# Patient Record
Sex: Female | Born: 2000 | Hispanic: No | Marital: Single | Smoking: Never smoker
Health system: Southern US, Community
[De-identification: ages and names within clinical notes are randomized; demographics above are authoritative.]

## PROBLEM LIST (undated history)

## (undated) ENCOUNTER — Inpatient Hospital Stay (HOSPITAL_COMMUNITY): Payer: Self-pay

## (undated) ENCOUNTER — Ambulatory Visit: Admission: EM

## (undated) DIAGNOSIS — F419 Anxiety disorder, unspecified: Secondary | ICD-10-CM

## (undated) DIAGNOSIS — D649 Anemia, unspecified: Secondary | ICD-10-CM

## (undated) DIAGNOSIS — R062 Wheezing: Secondary | ICD-10-CM

## (undated) DIAGNOSIS — N390 Urinary tract infection, site not specified: Secondary | ICD-10-CM

## (undated) HISTORY — PX: NO PAST SURGERIES: SHX2092

---
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
## 2020-06-10 ENCOUNTER — Other Ambulatory Visit: Payer: Self-pay

## 2020-06-10 ENCOUNTER — Emergency Department (HOSPITAL_COMMUNITY)
Admission: EM | Admit: 2020-06-10 | Discharge: 2020-06-10 | Disposition: A | Discharge status: Home / Self Care | Payer: Medicaid Other | Attending: Emergency Medicine | Admitting: Emergency Medicine

## 2020-06-10 ENCOUNTER — Encounter (HOSPITAL_COMMUNITY): Payer: Self-pay | Admitting: Emergency Medicine

## 2020-06-10 DIAGNOSIS — M545 Low back pain, unspecified: Secondary | ICD-10-CM | POA: Insufficient documentation

## 2020-06-10 DIAGNOSIS — Y92410 Unspecified street and highway as the place of occurrence of the external cause: Secondary | ICD-10-CM | POA: Insufficient documentation

## 2020-06-10 MED ORDER — CYCLOBENZAPRINE HCL 10 MG PO TABS: 10.0000 mg | ORAL_TABLET | Freq: Every day | ORAL | 0 refills | Status: DC

## 2020-06-10 NOTE — Discharge Instructions (Signed)
Please read instructions below. °Apply ice to your areas of pain for 20 minutes at a time. °You can take 600 mg of Advil/ibuprofen every 6 hours as needed for pain. °You can take flexeril every 12 hours as needed for muscle spasm. Be aware this medication can make you drowsy. °Schedule an appointment with your primary care provider to follow up on your visit today. °Return to the ER for severely worsening headache, vision changes, if new numbness or weakness in your arms or legs, inability to urinate, inability to hold your bowels, or concerning symptoms. ° °
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
## 2020-06-10 NOTE — ED Notes (Signed)
Patient left before discharge process could be completed.  
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
## 2020-08-10 ENCOUNTER — Other Ambulatory Visit: Payer: Self-pay

## 2020-08-10 ENCOUNTER — Emergency Department (HOSPITAL_COMMUNITY)
Admission: EM | Admit: 2020-08-10 | Discharge: 2020-08-10 | Disposition: A | Discharge status: Home / Self Care | Payer: Medicaid Other | Attending: Emergency Medicine | Admitting: Emergency Medicine

## 2020-08-10 DIAGNOSIS — R002 Palpitations: Secondary | ICD-10-CM | POA: Diagnosis present

## 2020-08-10 DIAGNOSIS — Z5321 Procedure and treatment not carried out due to patient leaving prior to being seen by health care provider: Secondary | ICD-10-CM | POA: Diagnosis not present

## 2020-08-10 DIAGNOSIS — R110 Nausea: Secondary | ICD-10-CM | POA: Diagnosis not present

## 2020-08-10 LAB — BASIC METABOLIC PANEL
Anion gap: 10 (ref 5–15)
BUN: 13 mg/dL (ref 6–20)
CO2: 23 mmol/L (ref 22–32)
Calcium: 9.3 mg/dL (ref 8.9–10.3)
Chloride: 104 mmol/L (ref 98–111)
Creatinine, Ser: 0.73 mg/dL (ref 0.44–1.00)
GFR, Estimated: >60 mL/min (ref >60)
Glucose, Bld: 86 mg/dL (ref 70–99)
Potassium: 3.4 mmol/L — ABNORMAL LOW (ref 3.5–5.1)
Sodium: 137 mmol/L (ref 135–145)

## 2020-08-10 LAB — I-STAT BETA HCG BLOOD, ED (MC, WL, AP ONLY): I-stat hCG, quantitative: <5 m[IU]/mL (ref <5)

## 2020-08-10 LAB — CBC
HCT: 38.3 % (ref 36.0–46.0)
Hemoglobin: 12.1 g/dL (ref 12.0–15.0)
MCH: 26.1 pg (ref 26.0–34.0)
MCHC: 31.6 g/dL (ref 30.0–36.0)
MCV: 82.5 fL (ref 80.0–100.0)
Platelets: 259 10*3/uL (ref 150–400)
RBC: 4.64 MIL/uL (ref 3.87–5.11)
RDW: 13.8 % (ref 11.5–15.5)
WBC: 5.9 10*3/uL (ref 4.0–10.5)
nRBC: 0 % (ref 0.0–0.2)

## 2020-08-10 NOTE — ED Notes (Signed)
Pt notified registration that she is not staying any longer

## 2020-08-10 NOTE — ED Triage Notes (Signed)
C/O heart palpitations going on for a month along w/ nausea. Denies PMH.

## 2020-09-03 ENCOUNTER — Encounter (HOSPITAL_COMMUNITY): Payer: Self-pay | Admitting: Emergency Medicine

## 2020-09-03 ENCOUNTER — Emergency Department (HOSPITAL_COMMUNITY): Payer: Medicaid Other

## 2020-09-03 ENCOUNTER — Other Ambulatory Visit: Payer: Self-pay

## 2020-09-03 ENCOUNTER — Emergency Department (HOSPITAL_COMMUNITY)
Admission: EM | Admit: 2020-09-03 | Discharge: 2020-09-03 | Disposition: A | Discharge status: Home / Self Care | Payer: Medicaid Other | Attending: Emergency Medicine | Admitting: Emergency Medicine

## 2020-09-03 DIAGNOSIS — R0602 Shortness of breath: Secondary | ICD-10-CM | POA: Diagnosis not present

## 2020-09-03 DIAGNOSIS — R002 Palpitations: Secondary | ICD-10-CM | POA: Diagnosis not present

## 2020-09-03 DIAGNOSIS — R0789 Other chest pain: Secondary | ICD-10-CM | POA: Diagnosis not present

## 2020-09-03 LAB — BASIC METABOLIC PANEL
Anion gap: 9 (ref 5–15)
BUN: 7 mg/dL (ref 6–20)
CO2: 24 mmol/L (ref 22–32)
Calcium: 9.2 mg/dL (ref 8.9–10.3)
Chloride: 105 mmol/L (ref 98–111)
Creatinine, Ser: 0.71 mg/dL (ref 0.44–1.00)
GFR, Estimated: >60 mL/min (ref >60)
Glucose, Bld: 91 mg/dL (ref 70–99)
Potassium: 3.4 mmol/L — ABNORMAL LOW (ref 3.5–5.1)
Sodium: 138 mmol/L (ref 135–145)

## 2020-09-03 LAB — CBC
HCT: 36.7 % (ref 36.0–46.0)
Hemoglobin: 12.2 g/dL (ref 12.0–15.0)
MCH: 27.4 pg (ref 26.0–34.0)
MCHC: 33.2 g/dL (ref 30.0–36.0)
MCV: 82.3 fL (ref 80.0–100.0)
Platelets: 226 10*3/uL (ref 150–400)
RBC: 4.46 MIL/uL (ref 3.87–5.11)
RDW: 14.5 % (ref 11.5–15.5)
WBC: 5.7 10*3/uL (ref 4.0–10.5)
nRBC: 0 % (ref 0.0–0.2)

## 2020-09-03 LAB — C-REACTIVE PROTEIN: CRP: <0.5 mg/dL (ref <1.0)

## 2020-09-03 LAB — I-STAT BETA HCG BLOOD, ED (MC, WL, AP ONLY): I-stat hCG, quantitative: <5 m[IU]/mL (ref <5)

## 2020-09-03 LAB — TROPONIN I (HIGH SENSITIVITY)
Troponin I (High Sensitivity): 2 ng/L (ref <18)
Troponin I (High Sensitivity): 3 ng/L (ref <18)

## 2020-09-03 LAB — BRAIN NATRIURETIC PEPTIDE: B Natriuretic Peptide: 9.9 pg/mL (ref 0.0–100.0)

## 2020-09-03 MED ORDER — NAPROXEN 250 MG PO TABS
500.0000 mg | ORAL_TABLET | Freq: Once | ORAL | Status: AC
  Administered 2020-09-03: 500 mg via ORAL
  Filled 2020-09-03: qty 2

## 2020-09-03 MED ORDER — POTASSIUM CHLORIDE CRYS ER 20 MEQ PO TBCR
40.0000 meq | EXTENDED_RELEASE_TABLET | Freq: Once | ORAL | Status: AC
  Administered 2020-09-03: 40 meq via ORAL
  Filled 2020-09-03: qty 2

## 2020-09-03 NOTE — ED Provider Notes (Signed)
[facility] EMERGENCY DEPARTMENT Provider Note   CSN: [id number] Arrival date & time: 09/03/20  1216     History Chief Complaint  Patient presents with  . Chest Pain    Jill Sweeney is a 20 y.o. female.  The history is provided by the patient.  Chest Pain Pain location:  L chest Pain quality: pressure   Pain radiates to:  Does not radiate Pain severity:  Mild Onset quality:  Gradual Duration:  3 weeks Timing:  Intermittent Progression:  Waxing and waning Chronicity:  Recurrent Relieved by:  Nothing Worsened by:  Nothing Ineffective treatments:  None tried Associated symptoms: palpitations and shortness of breath   Associated symptoms: no abdominal pain, no back pain, no cough, no fever and no vomiting   Risk factors: no birth control, not pregnant and no prior DVT/PE   Palpitations Palpitations quality:  Irregular Onset quality:  At rest Duration:  30 minutes Timing:  Intermittent Progression:  Waxing and waning Chronicity:  New Context: not caffeine, not dehydration and not exercise   Relieved by:  Nothing Worsened by:  Nothing Ineffective treatments:  None tried Associated symptoms: chest pain and shortness of breath   Associated symptoms: no back pain, no cough and no vomiting        Past Medical History:  Diagnosis Date  . Anemia   . Wheezing    as a small child    Patient Active Problem List   Diagnosis Date Noted  . Thyroiditis 02/02/2019  . Anemia 02/02/2019    History reviewed. No pertinent surgical history.   OB History   No obstetric history on file.     Family History  Problem Relation Age of Onset  . Obesity Mother   . Hypothyroidism Mother   . Diabetes Father     Social History   Tobacco Use  . Smoking status: Never Smoker  . Smokeless tobacco: Never Used  Substance Use Topics  . Alcohol use: No  . Drug use: No    Home Medications Prior to Admission medications   Medication Sig Start  Date End Date Taking? Authorizing Provider  amoxicillin (AMOXIL) 250 MG/5ML suspension Take 10 mL (500 mg total) by mouth 3 (three) times daily. Patient not taking: Reported on 09/03/2017 03/22/13   Marcellina Millin, MD  amoxicillin (AMOXIL) 500 MG capsule Take 1 capsule (500 mg total) by mouth 3 (three) times daily. Patient not taking: Reported on 09/03/2017 03/22/13   Marcellina Millin, MD  cyclobenzaprine (FLEXERIL) 10 MG tablet Take 1 tablet (10 mg total) by mouth at bedtime. 06/10/20   Robinson, [place] N, PA-C  ibuprofen (ADVIL,MOTRIN) 100 MG/5ML suspension Take 25.6 mLs (512 mg total) by mouth every 6 (six) hours as needed for fever or mild pain. Patient not taking: Reported on 09/03/2017 01/28/15   Marcellina Millin, MD  ibuprofen (CHILD IBUPROFEN) 100 MG/5ML suspension Take 20 mLs (400 mg total) by mouth every 6 (six) hours as needed (sore throat). Patient not taking: Reported on 09/03/2017 11/14/16   Ree Shay, MD  Multiple Vitamin (MULTIVITAMIN WITH MINERALS) TABS tablet Take 1 tablet by mouth daily.    [provider]  ondansetron (ZOFRAN) 4 MG tablet Take 1 tablet (4 mg total) by mouth every 8 (eight) hours as needed for nausea or vomiting. Patient not taking: Reported on 02/02/2019 09/03/17   Jacinto Halim, PA-C    Allergies    Patient has no known allergies.  Review of Systems   Review of Systems  Constitutional: Negative for chills and fever.  HENT: Negative for ear pain and sore throat.   Eyes: Negative for pain and visual disturbance.  Respiratory: Positive for shortness of breath. Negative for cough.   Cardiovascular: Positive for chest pain and palpitations.  Gastrointestinal: Negative for abdominal pain and vomiting.  Genitourinary: Negative for dysuria and hematuria.  Musculoskeletal: Negative for arthralgias and back pain.  Skin: Negative for color change and rash.  Neurological: Negative for seizures and syncope.  All other systems reviewed and are  negative.   Physical Exam Updated Vital Signs BP 93/62 (BP Location: Right Arm)   Pulse 70   Temp 98.3 F (36.8 C) (Oral)   Resp 18   LMP 08/27/2020   SpO2 99%   Physical Exam Vitals and nursing note reviewed.  Constitutional:      General: She is not in acute distress.    Appearance: She is well-developed and well-nourished.  HENT:     Head: Normocephalic and atraumatic.  Eyes:     Conjunctiva/sclera: Conjunctivae normal.  Cardiovascular:     Rate and Rhythm: Normal rate and regular rhythm.     Heart sounds: No murmur heard.   Pulmonary:     Effort: Pulmonary effort is normal. No respiratory distress.     Breath sounds: Normal breath sounds.  Abdominal:     Palpations: Abdomen is soft.     Tenderness: There is no abdominal tenderness.  Musculoskeletal:        General: No edema.     Cervical back: Neck supple.     Right lower leg: No tenderness. No edema.     Left lower leg: No tenderness. No edema.  Skin:    General: Skin is warm and dry.     Coloration: Skin is not pale.  Neurological:     Mental Status: She is alert.  Psychiatric:        Mood and Affect: Mood and affect normal.     ED Results / Procedures / Treatments   Labs (all labs ordered are listed, but only abnormal results are displayed) Labs Reviewed  BASIC METABOLIC PANEL - Abnormal; Notable for the following components:      Result Value   Potassium 3.4 (*)    All other components within normal limits  CBC  BRAIN NATRIURETIC PEPTIDE  C-REACTIVE PROTEIN  I-STAT BETA HCG BLOOD, ED ([facility], [facility], [facility] ONLY)  TROPONIN I (HIGH SENSITIVITY)  TROPONIN I (HIGH SENSITIVITY)    EKG EKG Interpretation  Date/Time:  Saturday September 03 2020 12:34:30 EST Ventricular Rate:  86 PR Interval:  132 QRS Duration: 72 QT Interval:  356 QTC Calculation: 426 R Axis:   108 Text Interpretation: Normal sinus rhythm Rightward axis ST-t wave abnormality Abnormal ECG Confirmed by Gerhard Munch [phone]) on 09/03/2020  3:41:33 PM   Radiology DG Chest 2 View  Result Date: 09/03/2020 CLINICAL DATA:  Mid to left-sided chest pain for 2 weeks but worsening last night. Shortness of breath. EXAM: CHEST - 2 VIEW COMPARISON:  02/03/2011 FINDINGS: The heart size and mediastinal contours are within normal limits. Both lungs are clear. The visualized skeletal structures are unremarkable. IMPRESSION: No active cardiopulmonary disease. Electronically Signed   By: Gaylyn Rong M.D.   On: 09/03/2020 12:54    Procedures Procedures  Medications Ordered in ED Medications  naproxen (NAPROSYN) tablet 500 mg (500 mg Oral Given 09/03/20 1643)  potassium chloride SA (KLOR-CON) CR tablet 40 mEq (40 mEq Oral Given 09/03/20 1711)    ED  Course  I have reviewed the triage vital signs and the nursing notes.  Pertinent labs & imaging results that were available during my care of the patient were reviewed by me and considered in my medical decision making (see chart for details).    MDM Rules/Calculators/A&P                          This is a 20 year old female with a reported past medical history of asthma, not on current medications, who presents emergency department for evaluation of chest pain, palpitations, and shortness of breath. Patient reports approximately 3 weeks ago she started to develop some chest pressure at times, this did not seem to be worse with activity and comes at random times usually when she is sitting down. Last night she had some shortness of breath and felt as though she had difficulty catching her breath but did not have much pain at that time. She also describes intermittent episodes of palpitations, feeling of her heart racing which are not provoked with any activity. Patient was diagnosed with COVID via home test approximately 1 month ago but did not develop severe symptoms or need to be hospitalized.  On exam the patient is afebrile, hemodynamically stable, well-appearing, heart sounds are normal  without any abnormalities on lung auscultation. Patient has no calf tenderness, swelling, skin changes. Low risk Wells, PERC negative.  Low suspicion for ACS or PE as she is not tachycardic, hypoxic, low risk factors for ACS. Patient's family history is negative for cardiac conditions and sudden cardiac death. Given her recent viral illness of COVID and now having chest pain, palpitations, will evaluate for possible underlying pericarditis myocarditis.   ECG showed normal sinus rhythm, rate of 86, borderline right axis deviation, PR intervals within normal limits, QRS is within normal limits, QTC is not prolonged, there is some nonspecific ST and T wave abnormalities noted in the inferior leads and lateral leads.  Question lead misplacement, repeat EKG does show normal sinus rhythm without significant ST or T wave abnormalities are previously in, the repeat EKG is more consistent with the one that she had in early January and appears to be normal.  Bedside echo shows no evidence of pericardial effusion, no right ventricle dilatation, maintained EF, no wall motion abnormalities.  Troponin within normal limits, BNP is not elevated, CRP is within normal limits, she is not tachycardic or febrile.  I doubt that she has a post viral myocarditis versus pericarditis.  She was instructed to use Tylenol and ibuprofen as needed for pain.  She was instructed to follow-up with her primary care physician to discuss possible further evaluation with loop recorder/Holter monitor if she feels that her palpitations are worsening.  Discussed caffeine restrictions that can worsen palpitations.   Final Clinical Impression(s) / ED Diagnoses Final diagnoses:  Atypical chest pain    Rx / DC Orders ED Discharge Orders    None       Kathleen Lime, MD 09/03/20 1755    Gerhard Munch, MD 09/04/20 [phone]

## 2020-09-03 NOTE — ED Triage Notes (Signed)
C/o heart palpitations, chest pain, and SOB x 4 weeks.

## 2020-09-03 NOTE — Discharge Instructions (Signed)
The blood work, chest x-ray, and EKG show that your heart is functioning normally and has no signs of damage at this time.  Please follow-up with your primary care physician, please use Tylenol and ibuprofen as needed to help with pain.  If the pain worsens, becomes constant, you feel you are more short of breath, passing out, or feel your heart rate is very rapid, please return to the emergency department.

## 2020-09-17 ENCOUNTER — Encounter (HOSPITAL_COMMUNITY): Payer: Self-pay

## 2020-09-17 ENCOUNTER — Emergency Department (HOSPITAL_COMMUNITY)
Admission: EM | Admit: 2020-09-17 | Discharge: 2020-09-18 | Disposition: A | Discharge status: Home / Self Care | Payer: Medicaid Other | Attending: Emergency Medicine | Admitting: Emergency Medicine

## 2020-09-17 DIAGNOSIS — R1030 Lower abdominal pain, unspecified: Secondary | ICD-10-CM | POA: Insufficient documentation

## 2020-09-17 DIAGNOSIS — Z5321 Procedure and treatment not carried out due to patient leaving prior to being seen by health care provider: Secondary | ICD-10-CM | POA: Insufficient documentation

## 2020-09-17 DIAGNOSIS — K5900 Constipation, unspecified: Secondary | ICD-10-CM | POA: Insufficient documentation

## 2020-09-17 LAB — URINALYSIS, ROUTINE W REFLEX MICROSCOPIC
Bacteria, UA: NONE SEEN
Bilirubin Urine: NEGATIVE
Glucose, UA: NEGATIVE mg/dL
Hgb urine dipstick: NEGATIVE
Ketones, ur: NEGATIVE mg/dL
Leukocytes,Ua: NEGATIVE
Nitrite: NEGATIVE
Protein, ur: NEGATIVE mg/dL
Specific Gravity, Urine: 1.023 (ref 1.005–1.030)
pH: 5 (ref 5.0–8.0)

## 2020-09-17 LAB — CBC
HCT: 35.7 % — ABNORMAL LOW (ref 36.0–46.0)
Hemoglobin: 12.1 g/dL (ref 12.0–15.0)
MCH: 27.9 pg (ref 26.0–34.0)
MCHC: 33.9 g/dL (ref 30.0–36.0)
MCV: 82.3 fL (ref 80.0–100.0)
Platelets: 255 10*3/uL (ref 150–400)
RBC: 4.34 MIL/uL (ref 3.87–5.11)
RDW: 14.6 % (ref 11.5–15.5)
WBC: 7.4 10*3/uL (ref 4.0–10.5)
nRBC: 0 % (ref 0.0–0.2)

## 2020-09-17 LAB — COMPREHENSIVE METABOLIC PANEL
ALT: 9 U/L (ref 0–44)
AST: 18 U/L (ref 15–41)
Albumin: 4.3 g/dL (ref 3.5–5.0)
Alkaline Phosphatase: 56 U/L (ref 38–126)
Anion gap: 11 (ref 5–15)
BUN: 10 mg/dL (ref 6–20)
CO2: 25 mmol/L (ref 22–32)
Calcium: 9.3 mg/dL (ref 8.9–10.3)
Chloride: 103 mmol/L (ref 98–111)
Creatinine, Ser: 0.69 mg/dL (ref 0.44–1.00)
GFR, Estimated: >60 mL/min (ref >60)
Glucose, Bld: 100 mg/dL — ABNORMAL HIGH (ref 70–99)
Potassium: 3.3 mmol/L — ABNORMAL LOW (ref 3.5–5.1)
Sodium: 139 mmol/L (ref 135–145)
Total Bilirubin: 0.6 mg/dL (ref 0.3–1.2)
Total Protein: 6.9 g/dL (ref 6.5–8.1)

## 2020-09-17 LAB — I-STAT BETA HCG BLOOD, ED (MC, WL, AP ONLY): I-stat hCG, quantitative: <5 m[IU]/mL (ref <5)

## 2020-09-17 LAB — LIPASE, BLOOD: Lipase: 32 U/L (ref 11–51)

## 2020-09-17 NOTE — ED Notes (Signed)
Pt called for vitals reassessment x3 with no response 

## 2020-09-17 NOTE — ED Triage Notes (Addendum)
Pt repots that she has been having lower abd pain, worse on the L side and also having problems with constipation.

## 2020-12-25 ENCOUNTER — Emergency Department (HOSPITAL_COMMUNITY)
Admission: EM | Admit: 2020-12-25 | Discharge: 2020-12-25 | Disposition: A | Discharge status: Home / Self Care | Payer: Medicaid Other | Attending: Emergency Medicine | Admitting: Emergency Medicine

## 2020-12-25 ENCOUNTER — Encounter (HOSPITAL_COMMUNITY): Payer: Self-pay | Admitting: Emergency Medicine

## 2020-12-25 ENCOUNTER — Other Ambulatory Visit: Payer: Self-pay

## 2020-12-25 DIAGNOSIS — E860 Dehydration: Secondary | ICD-10-CM | POA: Insufficient documentation

## 2020-12-25 DIAGNOSIS — Z20822 Contact with and (suspected) exposure to covid-19: Secondary | ICD-10-CM | POA: Diagnosis not present

## 2020-12-25 DIAGNOSIS — R55 Syncope and collapse: Secondary | ICD-10-CM | POA: Insufficient documentation

## 2020-12-25 LAB — COMPREHENSIVE METABOLIC PANEL
ALT: 11 U/L (ref 0–44)
AST: 17 U/L (ref 15–41)
Albumin: 4.1 g/dL (ref 3.5–5.0)
Alkaline Phosphatase: 48 U/L (ref 38–126)
Anion gap: 8 (ref 5–15)
BUN: 8 mg/dL (ref 6–20)
CO2: 27 mmol/L (ref 22–32)
Calcium: 9.5 mg/dL (ref 8.9–10.3)
Chloride: 103 mmol/L (ref 98–111)
Creatinine, Ser: 0.77 mg/dL (ref 0.44–1.00)
GFR, Estimated: >60 mL/min (ref >60)
Glucose, Bld: 89 mg/dL (ref 70–99)
Potassium: 3.6 mmol/L (ref 3.5–5.1)
Sodium: 138 mmol/L (ref 135–145)
Total Bilirubin: 0.5 mg/dL (ref 0.3–1.2)
Total Protein: 7.1 g/dL (ref 6.5–8.1)

## 2020-12-25 LAB — I-STAT BETA HCG BLOOD, ED (MC, WL, AP ONLY): I-stat hCG, quantitative: <5 m[IU]/mL (ref <5)

## 2020-12-25 LAB — CBC WITH DIFFERENTIAL/PLATELET
Abs Immature Granulocytes: 0.01 10*3/uL (ref 0.00–0.07)
Basophils Absolute: 0 10*3/uL (ref 0.0–0.1)
Basophils Relative: 1 %
Eosinophils Absolute: 0.1 10*3/uL (ref 0.0–0.5)
Eosinophils Relative: 1 %
HCT: 39.4 % (ref 36.0–46.0)
Hemoglobin: 12.9 g/dL (ref 12.0–15.0)
Immature Granulocytes: 0 %
Lymphocytes Relative: 30 %
Lymphs Abs: 1.8 10*3/uL (ref 0.7–4.0)
MCH: 28.9 pg (ref 26.0–34.0)
MCHC: 32.7 g/dL (ref 30.0–36.0)
MCV: 88.3 fL (ref 80.0–100.0)
Monocytes Absolute: 0.6 10*3/uL (ref 0.1–1.0)
Monocytes Relative: 10 %
Neutro Abs: 3.6 10*3/uL (ref 1.7–7.7)
Neutrophils Relative %: 58 %
Platelets: 269 10*3/uL (ref 150–400)
RBC: 4.46 MIL/uL (ref 3.87–5.11)
RDW: 14.7 % (ref 11.5–15.5)
WBC: 6.1 10*3/uL (ref 4.0–10.5)
nRBC: 0 % (ref 0.0–0.2)

## 2020-12-25 LAB — RESP PANEL BY RT-PCR (FLU A&B, COVID) ARPGX2
Influenza A by PCR: NEGATIVE
Influenza B by PCR: NEGATIVE
SARS Coronavirus 2 by RT PCR: NEGATIVE

## 2020-12-25 MED ORDER — SODIUM CHLORIDE 0.9 % IV BOLUS
1000.0000 mL | Freq: Once | INTRAVENOUS | Status: AC
  Administered 2020-12-25: 1000 mL via INTRAVENOUS

## 2020-12-25 NOTE — ED Notes (Signed)
Pt given cranberry juice.

## 2020-12-25 NOTE — ED Triage Notes (Signed)
Patient from home, complaint of syncopal episode 2 days ago and since then constant feeling of weakness. Endorses hx of anemia.

## 2020-12-25 NOTE — ED Provider Notes (Signed)
[facility] EMERGENCY DEPARTMENT Provider Note   CSN: [id number] Arrival date & time: 12/25/20  1417     History Chief Complaint  Patient presents with  . Loss of Consciousness    Jill Sweeney is a 20 y.o. female.  Pt presents to the ED today with a loc.  The pt said she passed out 2 days ago.  She said she got up from her bed and was walking to the bathroom when she blacked out.  She said she woke up shaking all over for about 2 minutes.  She has not felt well since then.  She denies any pain now.          Past Medical History:  Diagnosis Date  . Anemia   . Wheezing    as a small child    Patient Active Problem List   Diagnosis Date Noted  . Thyroiditis 02/02/2019  . Anemia 02/02/2019    History reviewed. No pertinent surgical history.   OB History   No obstetric history on file.     Family History  Problem Relation Age of Onset  . Obesity Mother   . Hypothyroidism Mother   . Diabetes Father     Social History   Tobacco Use  . Smoking status: Never Smoker  . Smokeless tobacco: Never Used  Substance Use Topics  . Alcohol use: No  . Drug use: No    Home Medications Prior to Admission medications   Medication Sig Start Date End Date Taking? Authorizing Provider  amoxicillin (AMOXIL) 250 MG/5ML suspension Take 10 mL (500 mg total) by mouth 3 (three) times daily. Patient not taking: Reported on 09/03/2017 03/22/13   Marcellina Millin, MD  amoxicillin (AMOXIL) 500 MG capsule Take 1 capsule (500 mg total) by mouth 3 (three) times daily. Patient not taking: Reported on 09/03/2017 03/22/13   Marcellina Millin, MD  cyclobenzaprine (FLEXERIL) 10 MG tablet Take 1 tablet (10 mg total) by mouth at bedtime. 06/10/20   Robinson, [place] N, PA-C  ibuprofen (ADVIL,MOTRIN) 100 MG/5ML suspension Take 25.6 mLs (512 mg total) by mouth every 6 (six) hours as needed for fever or mild pain. Patient not taking: Reported on 09/03/2017 01/28/15   Marcellina Millin, MD  ibuprofen (CHILD IBUPROFEN) 100 MG/5ML suspension Take 20 mLs (400 mg total) by mouth every 6 (six) hours as needed (sore throat). Patient not taking: Reported on 09/03/2017 11/14/16   Ree Shay, MD  Multiple Vitamin (MULTIVITAMIN WITH MINERALS) TABS tablet Take 1 tablet by mouth daily.    [provider]  ondansetron (ZOFRAN) 4 MG tablet Take 1 tablet (4 mg total) by mouth every 8 (eight) hours as needed for nausea or vomiting. Patient not taking: Reported on 02/02/2019 09/03/17   Jacinto Halim, PA-C    Allergies    Patient has no known allergies.  Review of Systems   Review of Systems  Constitutional: Positive for fatigue.  Neurological: Positive for syncope and weakness.  All other systems reviewed and are negative.   Physical Exam Updated Vital Signs BP (!) 97/58   Pulse 73   Temp 98 F (36.7 C)   Resp 15   SpO2 100%   Physical Exam Vitals and nursing note reviewed.  Constitutional:      Appearance: Normal appearance.  HENT:     Head: Normocephalic and atraumatic.     Right Ear: External ear normal.     Left Ear: External ear normal.     Nose: Nose normal.  Mouth/Throat:     Mouth: Mucous membranes are moist.     Pharynx: Oropharynx is clear.  Eyes:     Extraocular Movements: Extraocular movements intact.     Conjunctiva/sclera: Conjunctivae normal.     Pupils: Pupils are equal, round, and reactive to light.  Cardiovascular:     Rate and Rhythm: Normal rate and regular rhythm.     Pulses: Normal pulses.     Heart sounds: Normal heart sounds.  Pulmonary:     Effort: Pulmonary effort is normal.     Breath sounds: Normal breath sounds.  Abdominal:     General: Abdomen is flat. Bowel sounds are normal.     Palpations: Abdomen is soft.  Musculoskeletal:        General: Normal range of motion.     Cervical back: Normal range of motion and neck supple.  Skin:    General: Skin is warm.     Capillary Refill: Capillary refill takes less  than 2 seconds.  Neurological:     General: No focal deficit present.     Mental Status: She is alert and oriented to person, place, and time.  Psychiatric:        Mood and Affect: Mood normal.        Behavior: Behavior normal.        Thought Content: Thought content normal.        Judgment: Judgment normal.     ED Results / Procedures / Treatments   Labs (all labs ordered are listed, but only abnormal results are displayed) Labs Reviewed  RESP PANEL BY RT-PCR (FLU A&B, COVID) [id number]  COMPREHENSIVE METABOLIC PANEL  CBC WITH DIFFERENTIAL/PLATELET  URINALYSIS, ROUTINE W REFLEX MICROSCOPIC  I-STAT BETA HCG BLOOD, ED ([facility], [facility], [facility] ONLY)    EKG EKG Interpretation  Date/Time:  Sunday Dec 25 2020 15:22:41 EDT Ventricular Rate:  80 PR Interval:  132 QRS Duration: 70 QT Interval:  358 QTC Calculation: 412 R Axis:   92 Text Interpretation: Normal sinus rhythm Rightward axis Borderline ECG No significant change since last tracing Confirmed by Jacalyn Lefevre [phone]) on 12/25/2020 6:03:11 PM   Radiology No results found.  Procedures Procedures   Medications Ordered in ED Medications  sodium chloride 0.9 % bolus 1,000 mL (1,000 mLs Intravenous New Bag/Given 12/25/20 1926)    ED Course  I have reviewed the triage vital signs and the nursing notes.  Pertinent labs & imaging results that were available during my care of the patient were reviewed by me and considered in my medical decision making (see chart for details).    MDM Rules/Calculators/A&P                         Covid neg.  Pt is feeling much better.  Her bp is a little low (90s), but that is likely normal for her.  She is 4'11" and only 45 kg.  She is stable for d/c.  Return if worse.  Final Clinical Impression(s) / ED Diagnoses Final diagnoses:  Dehydration  Syncope, unspecified syncope type    Rx / DC Orders ED Discharge Orders    None       Jacalyn Lefevre, MD 12/25/20 2044

## 2020-12-25 NOTE — ED Provider Notes (Signed)
Emergency Medicine Provider Triage Evaluation Note  Jill Sweeney 20 y.o. female was evaluated in triage.  Pt complains of for evaluation of a syncopal episode that occurred 2 days ago.  She reports that since then, she has felt weak, lightheaded.  She states she has been fatigued as well.  She has a history of anemia.  She has never had a transfusion.  Denies any rectal bleeding.  She denies any chest pain, abdominal pain, nausea/vomiting.  Her LMP was 2 weeks ago.  She has had some intermittent shortness of breath.   Review of Systems  Positive: Weakness, fatigue, lightheadedness Negative: Fevers, abdominal pain, nausea/vomiting.  Physical Exam  BP 134/82   Pulse 70   Temp 98.2 F (36.8 C) (Oral)   Resp 18   Ht 5\' 4"  (1.626 m)   Wt 65.8 kg   SpO2 100%   BMI 24.89 kg/m  Gen:   Awake, no distress   HEENT:  Atraumatic  Resp:  Normal effort.  Able to speak in full sentences without any difficulty. Cardiac:  Normal rate  Abd:   Nondistended, nontender  MSK:   Moves extremities without difficulty  Neuro:  Speech clear   Other:      Medical Decision Making  Medically screening exam initiated at 3:02 PM  Appropriate orders placed.  Jill Sweeney was informed that the remainder of the evaluation will be completed by another provider, this initial triage assessment does not replace that evaluation. They are counseled that they will need to remain in the ED until the completion of their workup, including full H&P and results of any tests.  Risks of leaving the emergency department prior to completion of treatment were discussed. Patient was advised to inform ED staff if they are leaving before their treatment is complete. The patient acknowledged these risks and time was allowed for questions.     The patient appears stable so that the remainder of the MSE may be completed by another provider.   Clinical Impression  Generalized weakness.   Portions of this note  were generated with . Dictation errors may occur despite best attempts at proofreading.      Scientist, clinical (histocompatibility and immunogenetics), PA-C 12/25/20 1503    12/27/20, MD 12/25/20 1515

## 2020-12-25 NOTE — ED Notes (Signed)
Dr. Particia Nearing made aware of pt BP 88/65

## 2020-12-25 NOTE — ED Notes (Signed)
E-signature pad unavailable at time of pt discharge. This RN discussed discharge materials with pt and answered all pt questions. Pt stated understanding of discharge material. ? ?

## 2021-04-20 ENCOUNTER — Encounter (HOSPITAL_COMMUNITY): Payer: Self-pay

## 2021-04-20 ENCOUNTER — Emergency Department (HOSPITAL_COMMUNITY)
Admission: EM | Admit: 2021-04-20 | Discharge: 2021-04-20 | Disposition: A | Discharge status: Home / Self Care | Payer: Medicaid Other | Attending: Emergency Medicine | Admitting: Emergency Medicine

## 2021-04-20 DIAGNOSIS — R55 Syncope and collapse: Secondary | ICD-10-CM | POA: Insufficient documentation

## 2021-04-20 DIAGNOSIS — R519 Headache, unspecified: Secondary | ICD-10-CM | POA: Insufficient documentation

## 2021-04-20 DIAGNOSIS — R002 Palpitations: Secondary | ICD-10-CM | POA: Diagnosis not present

## 2021-04-20 DIAGNOSIS — N9489 Other specified conditions associated with female genital organs and menstrual cycle: Secondary | ICD-10-CM | POA: Insufficient documentation

## 2021-04-20 DIAGNOSIS — R42 Dizziness and giddiness: Secondary | ICD-10-CM | POA: Diagnosis not present

## 2021-04-20 LAB — CBC WITH DIFFERENTIAL/PLATELET
Abs Immature Granulocytes: 0.02 10*3/uL (ref 0.00–0.07)
Basophils Absolute: 0 10*3/uL (ref 0.0–0.1)
Basophils Relative: 0 %
Eosinophils Absolute: 0 10*3/uL (ref 0.0–0.5)
Eosinophils Relative: 0 %
HCT: 35 % — ABNORMAL LOW (ref 36.0–46.0)
Hemoglobin: 11.4 g/dL — ABNORMAL LOW (ref 12.0–15.0)
Immature Granulocytes: 0 %
Lymphocytes Relative: 23 %
Lymphs Abs: 1.7 10*3/uL (ref 0.7–4.0)
MCH: 28.2 pg (ref 26.0–34.0)
MCHC: 32.6 g/dL (ref 30.0–36.0)
MCV: 86.6 fL (ref 80.0–100.0)
Monocytes Absolute: 0.7 10*3/uL (ref 0.1–1.0)
Monocytes Relative: 9 %
Neutro Abs: 4.7 10*3/uL (ref 1.7–7.7)
Neutrophils Relative %: 68 %
Platelets: 216 10*3/uL (ref 150–400)
RBC: 4.04 MIL/uL (ref 3.87–5.11)
RDW: 13.1 % (ref 11.5–15.5)
WBC: 7.1 10*3/uL (ref 4.0–10.5)
nRBC: 0 % (ref 0.0–0.2)

## 2021-04-20 LAB — COMPREHENSIVE METABOLIC PANEL
ALT: 11 U/L (ref 0–44)
AST: 17 U/L (ref 15–41)
Albumin: 3.9 g/dL (ref 3.5–5.0)
Alkaline Phosphatase: 47 U/L (ref 38–126)
Anion gap: 3 — ABNORMAL LOW (ref 5–15)
BUN: 11 mg/dL (ref 6–20)
CO2: 25 mmol/L (ref 22–32)
Calcium: 8.6 mg/dL — ABNORMAL LOW (ref 8.9–10.3)
Chloride: 115 mmol/L — ABNORMAL HIGH (ref 98–111)
Creatinine, Ser: 0.62 mg/dL (ref 0.44–1.00)
GFR, Estimated: >60 mL/min (ref >60)
Glucose, Bld: 82 mg/dL (ref 70–99)
Potassium: 3.7 mmol/L (ref 3.5–5.1)
Sodium: 143 mmol/L (ref 135–145)
Total Bilirubin: 0.5 mg/dL (ref 0.3–1.2)
Total Protein: 6.7 g/dL (ref 6.5–8.1)

## 2021-04-20 LAB — I-STAT BETA HCG BLOOD, ED (MC, WL, AP ONLY): I-stat hCG, quantitative: <5 m[IU]/mL (ref <5)

## 2021-04-20 LAB — TSH: TSH: 0.935 u[IU]/mL (ref 0.350–4.500)

## 2021-04-20 MED ORDER — LACTATED RINGERS IV BOLUS
1000.0000 mL | Freq: Once | INTRAVENOUS | Status: AC
  Administered 2021-04-20: 1000 mL via INTRAVENOUS

## 2021-04-20 MED ORDER — LACTATED RINGERS IV SOLN: INTRAVENOUS | Status: DC

## 2021-04-20 NOTE — ED Triage Notes (Signed)
Pt presents with c/o headache that started today with some associated dizziness and near syncope.

## 2021-04-20 NOTE — Discharge Instructions (Addendum)
You are seen in the emergency department today for what sounds like a near syncopal episode.  You also complained of having palpitations or feeling like her heart was racing.  While you were here we obtained lab work including checking your thyroid, which was all reassuring.  We also obtained a picture of your heart called EKG which was normal.  You have anemia on your lab work which you are aware of.  Continue taking your iron.  We gave you 1 L of IV fluids in the event that you are dehydrated.  I have asked that you call your primary care provider for a follow-up visit.  You also may return to the emergency department for any reason including return of your symptoms feel like her heart is racing, chest pain, passing out.

## 2021-04-20 NOTE — ED Provider Notes (Signed)
[facility]-EMERGENCY DEPT Provider Note   CSN: [id number] Arrival date & time: 04/20/21  1418     History Chief Complaint  Patient presents with   Headache   Near Syncope    Jill Sweeney is a 20 y.o. female.  With history of anemia who presents to the emergency department with palpitations.  States that today she was returning from school.  States that she was sitting on the couch and felt like her heart was racing.  States that she attempted to walk outside to call her brother but then began feeling like she was about to pass out.  States that she also felt "incoherent" like she was "going numb" for approximately 15 minutes.  States after that she just decided to call EMS.  She also endorses a headache that began last night, that is intermittent.  She denies shortness of breath, chest pain, fevers, cough, recent travel, nausea/vomiting/diarrhea/abdominal pain.  She states she had a syncopal episode 3 months ago.  States that she was seen at her primary care physician who said she was dehydrated.  States that her last menstrual period was 3 weeks ago.  Endorses heavy periods.  With her history of anemia she takes iron daily.   Headache Associated symptoms: dizziness and near-syncope   Associated symptoms: no abdominal pain, no cough, no fatigue, no fever, no nausea and no vomiting   Near Syncope Associated symptoms include headaches. Pertinent negatives include no chest pain, no abdominal pain and no shortness of breath.      Past Medical History:  Diagnosis Date   Anemia    Wheezing    as a small child    Patient Active Problem List   Diagnosis Date Noted   Thyroiditis 02/02/2019   Anemia 02/02/2019    History reviewed. No pertinent surgical history.   OB History   No obstetric history on file.     Family History  Problem Relation Age of Onset   Obesity Mother    Hypothyroidism Mother    Diabetes Father     Social History    Tobacco Use   Smoking status: Never   Smokeless tobacco: Never  Substance Use Topics   Alcohol use: No   Drug use: No    Home Medications Prior to Admission medications   Medication Sig Start Date End Date Taking? Authorizing Provider  amoxicillin (AMOXIL) 250 MG/5ML suspension Take 10 mL (500 mg total) by mouth 3 (three) times daily. Patient not taking: Reported on 09/03/2017 03/22/13   Marcellina Millin, MD  amoxicillin (AMOXIL) 500 MG capsule Take 1 capsule (500 mg total) by mouth 3 (three) times daily. Patient not taking: Reported on 09/03/2017 03/22/13   Marcellina Millin, MD  cyclobenzaprine (FLEXERIL) 10 MG tablet Take 1 tablet (10 mg total) by mouth at bedtime. 06/10/20   Robinson, [place] N, PA-C  ibuprofen (ADVIL,MOTRIN) 100 MG/5ML suspension Take 25.6 mLs (512 mg total) by mouth every 6 (six) hours as needed for fever or mild pain. Patient not taking: Reported on 09/03/2017 01/28/15   Marcellina Millin, MD  ibuprofen (CHILD IBUPROFEN) 100 MG/5ML suspension Take 20 mLs (400 mg total) by mouth every 6 (six) hours as needed (sore throat). Patient not taking: Reported on 09/03/2017 11/14/16   Ree Shay, MD  Multiple Vitamin (MULTIVITAMIN WITH MINERALS) TABS tablet Take 1 tablet by mouth daily.    [provider]  ondansetron (ZOFRAN) 4 MG tablet Take 1 tablet (4 mg total) by mouth every 8 (eight) hours  as needed for nausea or vomiting. Patient not taking: Reported on 02/02/2019 09/03/17   Jacinto Halim, PA-C    Allergies    Patient has no known allergies.  Review of Systems   Review of Systems  Constitutional:  Negative for diaphoresis, fatigue and fever.  HENT:  Negative for sinus pain.   Respiratory:  Negative for cough and shortness of breath.   Cardiovascular:  Positive for palpitations and near-syncope. Negative for chest pain.  Gastrointestinal:  Negative for abdominal pain, nausea and vomiting.  Neurological:  Positive for dizziness, light-headedness and headaches.   All other systems reviewed and are negative.  Physical Exam Updated Vital Signs BP (!) 95/57   Pulse 69   Temp 98.7 F (37.1 C) (Oral)   Resp 19   LMP 03/30/2021   SpO2 100%   Physical Exam Vitals and nursing note reviewed.  Constitutional:      Appearance: She is well-developed. She is not ill-appearing.  HENT:     Head: Normocephalic and atraumatic.  Eyes:     General: No scleral icterus.    Extraocular Movements: Extraocular movements intact.     Pupils: Pupils are equal, round, and reactive to light.  Cardiovascular:     Rate and Rhythm: Normal rate and regular rhythm.     Pulses:          Radial pulses are 2+ on the right side and 2+ on the left side.     Heart sounds: Normal heart sounds. No murmur heard. Pulmonary:     Effort: Pulmonary effort is normal. No respiratory distress.     Breath sounds: Normal breath sounds.  Abdominal:     General: Bowel sounds are normal.     Palpations: Abdomen is soft.     Tenderness: There is no abdominal tenderness.  Musculoskeletal:     Cervical back: Normal range of motion.     Right lower leg: No edema.     Left lower leg: No edema.  Skin:    General: Skin is warm and dry.     Capillary Refill: Capillary refill takes less than 2 seconds.  Neurological:     General: No focal deficit present.     Mental Status: She is alert and oriented to person, place, and time. Mental status is at baseline.     Sensory: No sensory deficit.  Psychiatric:        Mood and Affect: Mood normal.        Behavior: Behavior normal.        Thought Content: Thought content normal.        Judgment: Judgment normal.    ED Results / Procedures / Treatments   Labs (all labs ordered are listed, but only abnormal results are displayed) Labs Reviewed  CBC WITH DIFFERENTIAL/PLATELET - Abnormal; Notable for the following components:      Result Value   Hemoglobin 11.4 (*)    HCT 35.0 (*)    All other components within normal limits  COMPREHENSIVE  METABOLIC PANEL - Abnormal; Notable for the following components:   Chloride 115 (*)    Calcium 8.6 (*)    Anion gap 3 (*)    All other components within normal limits  TSH  I-STAT BETA HCG BLOOD, ED ([facility], [facility], [facility] ONLY)    EKG None  Radiology No results found.  Procedures Procedures   Medications Ordered in ED Medications  lactated ringers bolus 1,000 mL (0 mLs Intravenous Stopped 04/20/21 1725)  ED Course  I have reviewed the triage vital signs and the nursing notes.  Pertinent labs & imaging results that were available during my care of the patient were reviewed by me and considered in my medical decision making (see chart for details).    MDM Rules/Calculators/A&P Jill Sweeney is a 20 year old female with past medical history of anemia who presents emergency department for what sounds like presyncope/palpitations.  Given her history, exam and work-up I have low suspicion for heart failure or any intracranial abnormalities.  There is no reported seizure-like activity, she had no postictal period, no bladder incontinence so doubt seizure. Presentation is not consistent with ACS, no anginal pain. EKG without arrhythmia. Hemoglobin 11.4 which is near baseline of 12. I have low suspicion for pulmonary embolism.  Her vital signs are stable.  No hypoxia, shortness of breath.  No evidence of DVT on physical exam.  No other risk factors.  [place] syncope risk score of 0. She is low risk and well-appearing.  Her vital signs continue to be stable.  Plan to discharge home with primary care follow-up.  Patient is agreeable to plan.  Final Clinical Impression(s) / ED Diagnoses Final diagnoses:  Near syncope    Rx / DC Orders ED Discharge Orders     None        Cristopher Peru, PA-C 04/20/21 1746    Gloris Manchester, MD 04/21/21 [phone]

## 2021-04-20 NOTE — ED Triage Notes (Signed)
Per EMS-seen at [facility] a couple of days ago for dehydration-states she has been having H/A's for 3 months

## 2021-05-11 ENCOUNTER — Emergency Department (HOSPITAL_COMMUNITY)
Admission: EM | Admit: 2021-05-11 | Discharge: 2021-05-12 | Disposition: A | Discharge status: Home / Self Care | Payer: Medicaid Other | Attending: Emergency Medicine | Admitting: Emergency Medicine

## 2021-05-11 ENCOUNTER — Other Ambulatory Visit: Payer: Self-pay

## 2021-05-11 ENCOUNTER — Encounter (HOSPITAL_COMMUNITY): Payer: Self-pay

## 2021-05-11 DIAGNOSIS — R110 Nausea: Secondary | ICD-10-CM | POA: Insufficient documentation

## 2021-05-11 DIAGNOSIS — R309 Painful micturition, unspecified: Secondary | ICD-10-CM | POA: Diagnosis not present

## 2021-05-11 DIAGNOSIS — R300 Dysuria: Secondary | ICD-10-CM | POA: Diagnosis not present

## 2021-05-11 DIAGNOSIS — R109 Unspecified abdominal pain: Secondary | ICD-10-CM | POA: Insufficient documentation

## 2021-05-11 DIAGNOSIS — R1110 Vomiting, unspecified: Secondary | ICD-10-CM

## 2021-05-11 DIAGNOSIS — Z5321 Procedure and treatment not carried out due to patient leaving prior to being seen by health care provider: Secondary | ICD-10-CM | POA: Insufficient documentation

## 2021-05-11 LAB — CBC
HCT: 38.8 % (ref 36.0–46.0)
Hemoglobin: 12.9 g/dL (ref 12.0–15.0)
MCH: 28.4 pg (ref 26.0–34.0)
MCHC: 33.2 g/dL (ref 30.0–36.0)
MCV: 85.3 fL (ref 80.0–100.0)
Platelets: 255 10*3/uL (ref 150–400)
RBC: 4.55 MIL/uL (ref 3.87–5.11)
RDW: 13.3 % (ref 11.5–15.5)
WBC: 11.1 10*3/uL — ABNORMAL HIGH (ref 4.0–10.5)
nRBC: 0 % (ref 0.0–0.2)

## 2021-05-11 LAB — URINALYSIS, ROUTINE W REFLEX MICROSCOPIC
Bilirubin Urine: NEGATIVE
Glucose, UA: NEGATIVE mg/dL
Hgb urine dipstick: NEGATIVE
Ketones, ur: 80 mg/dL — AB
Leukocytes,Ua: NEGATIVE
Nitrite: NEGATIVE
Protein, ur: 30 mg/dL — AB
Specific Gravity, Urine: 1.03 (ref 1.005–1.030)
pH: 5 (ref 5.0–8.0)

## 2021-05-11 LAB — BASIC METABOLIC PANEL
Anion gap: 9 (ref 5–15)
BUN: 10 mg/dL (ref 6–20)
CO2: 25 mmol/L (ref 22–32)
Calcium: 9.1 mg/dL (ref 8.9–10.3)
Chloride: 103 mmol/L (ref 98–111)
Creatinine, Ser: 0.61 mg/dL (ref 0.44–1.00)
GFR, Estimated: >60 mL/min (ref >60)
Glucose, Bld: 99 mg/dL (ref 70–99)
Potassium: 3.5 mmol/L (ref 3.5–5.1)
Sodium: 137 mmol/L (ref 135–145)

## 2021-05-11 LAB — PREGNANCY, URINE: Preg Test, Ur: NEGATIVE

## 2021-05-11 MED ORDER — ONDANSETRON 4 MG PO TBDP
4.0000 mg | ORAL_TABLET | Freq: Once | ORAL | Status: AC
  Administered 2021-05-11: 4 mg via ORAL
  Filled 2021-05-11: qty 1

## 2021-05-11 NOTE — ED Provider Notes (Signed)
Emergency Medicine Provider Triage Evaluation Note  Jill Sweeney , a 20 y.o. female  was evaluated in triage.  Pt complains of abdominal pain, nausea, dysuria onset this morning.  LMP 9/20.   Review of Systems  Positive: Abdominal pain, nausea, vomiting, dysuria Negative: Fever, back pain, vaginal discharge  Physical Exam  BP (!) 105/57   Pulse (!) 104   Temp 98.1 F (36.7 C) (Oral)   Resp 18   SpO2 100%  Gen:   Awake, no distress   Resp:  Normal effort  MSK:   Moves extremities without difficulty  Other:    Medical Decision Making  Medically screening exam initiated at 2:34 PM.  Appropriate orders placed.  Juan P Lorenzo-Garcia was informed that the remainder of the evaluation will be completed by another provider, this initial triage assessment does not replace that evaluation, and the importance of remaining in the ED until their evaluation is complete.  Abdominal pain   Marita Kansas, PA-C 05/11/21 1436    Long, Arlyss Repress, MD 05/15/21 [phone]

## 2021-05-11 NOTE — ED Triage Notes (Signed)
Pt c.o abd pain, emesis since 6am. Burning with urination

## 2021-05-12 NOTE — ED Notes (Signed)
Patient called to recheck vital signs x3, did not respond. Pulling OTF.
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
## 2021-05-31 ENCOUNTER — Ambulatory Visit: Payer: Medicaid Other | Admitting: Psychiatry

## 2021-05-31 ENCOUNTER — Encounter: Payer: Self-pay | Admitting: Psychiatry

## 2021-08-06 DIAGNOSIS — Z5189 Encounter for other specified aftercare: Secondary | ICD-10-CM

## 2021-08-06 HISTORY — DX: Encounter for other specified aftercare: Z51.89

## 2022-03-02 ENCOUNTER — Emergency Department (HOSPITAL_COMMUNITY)
Admission: EM | Admit: 2022-03-02 | Discharge: 2022-03-02 | Discharge status: Against Medical Advice | Payer: Medicaid Other

## 2022-03-02 NOTE — ED Notes (Addendum)
Pt left. Pt seen walking out to parking lot with her mother. Taking pt OTF.

## 2022-03-04 NOTE — Care Plan (Signed)
 Vital signs WDL. Vaginal bleeding scant. Patient voiding clear yellow urine. Denies pain. Seen by GYN Benign Provider and determined to be discharged home. All needs met. Patient discharged home as ordered. Home care instructions given and verbalized good understanding. Patient left the unit with her belongings accompanied by mother and significant other. Problem: Adult Inpatient Plan of Care Goal: Plan of Care Review Outcome: Discharged to Home Goal: Patient-Specific Goal (Individualized) Outcome: Discharged to Home Goal: Absence of Hospital-Acquired Illness or Injury Outcome: Discharged to Home Intervention: Identify and Manage Fall Risk Safety Interventions: . low bed . fall reduction program maintained     Intervention: Prevent and Manage VTE (Venous Thromboembolism) Ris Activity Management: sitting, edge of bed Taken 03/04/2022 0701 by Thad JONELLE Rasmussen, RN Activity Management: . activity adjusted per tolerance . sitting, edge of bed Goal: Optimal Comfort and Wellbeing Outcome: Discharged to Home Goal: Readiness for Transition of Care Outcome: Discharged to Home Goal: Rounds/Family Conference Outcome: Discharged to Home   Problem: Fluid Volume Deficit Goal: Fluid Balance Outcome: Discharged to Home   Problem: Latex Allergy Goal: Absence of Allergy Symptoms Outcome: Discharged to Home Intervention: Maintain Latex-Aware Environment Latex Precautions: latex precautions maintained   Problem: Fall Injury Risk Goal: Absence of Fall and Fall-Related Injury Outcome: Discharged to Home Intervention: Promote Injury-Free Environment Recent Flowsheet Documentation Taken 03/04/2022 0701 by Thad JONELLE Rasmussen, RN Safety Interventions: . low bed . fall reduction program maintained

## 2023-12-06 NOTE — Progress Notes (Signed)
 [facility] at [facility] New Patient Clinic Note  Assessment/Plan: Jill Sweeney is a 23 y.o.female who presents to establish care.  Problem List Items Addressed This Visit   None Visit Diagnoses       Dysuria    -  Primary   Relevant Medications   nitrofurantoin, macrocrystal-monohydrate, (MACROBID) 100 MG capsule   Other Relevant Orders   POCT Pregnancy Urine, Qualitative      - Current meds:  Current Outpatient Medications:  .  escitalopram oxalate (LEXAPRO) 5 MG tablet, Take 1 tablet (5 mg total) by mouth daily., Disp: , Rfl:  .  nitrofurantoin, macrocrystal-monohydrate, (MACROBID) 100 MG capsule, Take 1 capsule (100 mg total) by mouth two (2) times a day for 7 days., Disp: 14 capsule, Rfl: 0 Assessment & Plan # Dysuria, suspect urinary tract infection (UTI) Symptoms and POCT UA suggest UTI. Differential includes yeast infection and bacterial vaginosis. No systemic symptoms reported. Mild suprapubic cramping, no lower back pain. After discussion of eval/management options, pt would like to proceed with empiric treatment with macrobid; declines formal UA/culture or wet prep, but will RTC for these if no improvement by Monday. Also request UPT which is negative. Return precautions provided, pt verbalizes understanding and has no further questions or concerns at this time.  - POCT UA - POCT Pregnancy Urine, Qualitative - nitrofurantoin, macrocrystal-monohydrate, (MACROBID) 100 MG capsule; Take 1 capsule (100 mg total) by mouth two (2) times a day for 7 days.  Dispense: 14 capsule; Refill: 0 - RTC for formal UA/culture + wet prep if no improvement by Monday 5/5   HEALTH MAINTENANCE ITEMS STILL DUE: Health Maintenance Due  Topic Date Due  . Meningococcal B Vaccines (1 of 2 - Standard) Never done  . Hepatitis C Screen  Never done  . DTaP/Tdap/Td Vaccines (7 - Td or Tdap) 02/26/2022  . Pap Smear with Reflex HPV (21-29)  Never done  . Gonorrhea Screening   03/06/2023  . Chlamydia Screening  03/06/2023  . COVID-19 Vaccine (5 - 2024-25 season) 04/07/2023   Follow-up: No follow-ups on file.  No future appointments.  Subjective Jill Sweeney is a 23 y.o. female  coming to clinic today for the following issues:  Chief Complaint  Patient presents with  . Establish Care    Possible UTI, no pain with urination but it's uncomfortable and has urinary frequency   History of Present Illness Jill Sweeney is a 23 year old female who presents with dysuria for the past two days. She experiences frequent urination and an urge to urinate even when unnecessary. There is a slight pink discoloration when wiping, which she is unsure is blood. Urination is uncomfortable but not burning, and there is some itchiness. These symptoms began approximately two days ago. No history of urinary tract infections since childhood. No fever, chills, back pain, or abdominal cramping. She describes a general feeling of suprapubic discomfort and bladder irritation.   I have reviewed the problem list, medications, and allergies and have updated/reconciled them if needed.  Ms. Sweeney  reports that she has never smoked. She has never been exposed to tobacco smoke. She has never used smokeless tobacco. Health Maintenance  Topic Date Due  . Meningococcal B Vaccines (1 of 2 - Standard) Never done  . Hepatitis C Screen  Never done  . DTaP/Tdap/Td Vaccines (7 - Td or Tdap) 02/26/2022  . Pap Smear with Reflex HPV (21-29)  Never done  . Gonorrhea Screening  03/06/2023  .  Chlamydia Screening  03/06/2023  . COVID-19 Vaccine (5 - 2024-25 season) 04/07/2023  . Influenza Vaccine (Season Ended) 04/06/2024  . HPV Vaccines  Completed  . Pneumococcal Vaccine 0-49  Aged Out    Objective  VITALS: BP 93/61 (BP Site: R Arm, BP Position: Sitting, BP Cuff Size: Medium)   Pulse 90   Temp 37 C (98.6 F) (Temporal)   Ht 149.9 cm (4' 11)   Wt 46.6 kg (102 lb 12.8 oz)   LMP  11/19/2023 (Exact Date)   Breastfeeding No   BMI 20.76 kg/m   Physical Exam General: well-appearing, sitting upright in no acute distress Head: Normocephalic, atraumatic ENT: No dental trauma noted.  Eyes: conjunctiva normal, non-erythematous, non-icteric, no discharge. Neck: no thyroid  enlargement or masses Lungs: No increased work of breathing or audible wheezing Skin: Warm, dry, no erythema or rash on exposed skin Musculoskeletal: No visible gait abnormalities Neurologic: Alert & oriented x 3, no gross sensorimotor abnormalities Psychiatric: Pleasant, cooperative, good eye contact, appropriate thought processes  Wt Readings from Last 3 Encounters:  12/06/23 46.6 kg (102 lb 12.8 oz)  03/03/22 44.5 kg (98 lb 1.7 oz)  05/15/21 45.4 kg (100 lb)      PHQ-2 Score: 0  Screening complete, no depression identified / no further action needed today  LABS/IMAGING I have reviewed pertinent recent labs and imaging in Epic  Odis Aho, MD, MPH (he/him) [facility] at [facility] [address]  [place], [place] [address] Phone: [phone] Fax: [phone]

## 2024-01-14 ENCOUNTER — Ambulatory Visit
Admission: RE | Admit: 2024-01-14 | Discharge: 2024-01-14 | Disposition: A | Discharge status: Home / Self Care | Payer: Worker's Compensation | Source: Ambulatory Visit | Attending: Physician Assistant | Admitting: Physician Assistant

## 2024-01-14 ENCOUNTER — Ambulatory Visit
Admission: RE | Admit: 2024-01-14 | Discharge: 2024-01-14 | Disposition: A | Discharge status: Home / Self Care | Payer: Worker's Compensation | Attending: Physician Assistant | Admitting: Physician Assistant

## 2024-01-14 ENCOUNTER — Other Ambulatory Visit: Payer: Self-pay | Admitting: Physician Assistant

## 2024-01-14 DIAGNOSIS — M25572 Pain in left ankle and joints of left foot: Secondary | ICD-10-CM | POA: Insufficient documentation

## 2024-04-23 ENCOUNTER — Inpatient Hospital Stay (HOSPITAL_COMMUNITY)
Admission: AD | Admit: 2024-04-23 | Discharge: 2024-04-23 | Disposition: A | Discharge status: Home / Self Care | Attending: Obstetrics and Gynecology | Admitting: Obstetrics and Gynecology

## 2024-04-23 ENCOUNTER — Encounter (HOSPITAL_COMMUNITY): Payer: Self-pay | Admitting: Obstetrics & Gynecology

## 2024-04-23 DIAGNOSIS — O039 Complete or unspecified spontaneous abortion without complication: Secondary | ICD-10-CM | POA: Diagnosis not present

## 2024-04-23 DIAGNOSIS — O209 Hemorrhage in early pregnancy, unspecified: Secondary | ICD-10-CM | POA: Diagnosis present

## 2024-04-23 DIAGNOSIS — Z3A01 Less than 8 weeks gestation of pregnancy: Secondary | ICD-10-CM | POA: Diagnosis not present

## 2024-04-23 DIAGNOSIS — O4691 Antepartum hemorrhage, unspecified, first trimester: Secondary | ICD-10-CM | POA: Insufficient documentation

## 2024-04-23 DIAGNOSIS — O3680X0 Pregnancy with inconclusive fetal viability, not applicable or unspecified: Secondary | ICD-10-CM | POA: Diagnosis not present

## 2024-04-23 LAB — URINALYSIS, ROUTINE W REFLEX MICROSCOPIC
Bilirubin Urine: NEGATIVE
Glucose, UA: NEGATIVE mg/dL
Ketones, ur: NEGATIVE mg/dL
Nitrite: NEGATIVE
Protein, ur: NEGATIVE mg/dL
Specific Gravity, Urine: 1.013 (ref 1.005–1.030)
pH: 6 (ref 5.0–8.0)

## 2024-04-23 LAB — CBC
HCT: 35.5 % — ABNORMAL LOW (ref 36.0–46.0)
Hemoglobin: 11.3 g/dL — ABNORMAL LOW (ref 12.0–15.0)
MCH: 25.6 pg — ABNORMAL LOW (ref 26.0–34.0)
MCHC: 31.8 g/dL (ref 30.0–36.0)
MCV: 80.5 fL (ref 80.0–100.0)
Platelets: 271 K/uL (ref 150–400)
RBC: 4.41 MIL/uL (ref 3.87–5.11)
RDW: 14.9 % (ref 11.5–15.5)
WBC: 7.1 K/uL (ref 4.0–10.5)
nRBC: 0 % (ref 0.0–0.2)

## 2024-04-23 LAB — WET PREP, GENITAL
Clue Cells Wet Prep HPF POC: NONE SEEN
Sperm: NONE SEEN
Trich, Wet Prep: NONE SEEN
WBC, Wet Prep HPF POC: >=10 — AB (ref <10)
Yeast Wet Prep HPF POC: NONE SEEN

## 2024-04-23 LAB — HCG, QUANTITATIVE, PREGNANCY: hCG, Beta Chain, Quant, S: 155 m[IU]/mL — ABNORMAL HIGH (ref <5)

## 2024-04-23 LAB — POCT PREGNANCY, URINE: Preg Test, Ur: POSITIVE — AB

## 2024-04-23 NOTE — Progress Notes (Signed)
 Written and verbal d/c instructions given by Dr Magali and pt voiced understanding. PT then d/c home by MD

## 2024-04-23 NOTE — MAU Note (Signed)
 Jill Sweeney is a 23 y.o. at Unknown here in MAU reporting spotting for a few days but today covered a panty liner. She had SAB in the past and required 2 units of blood so she got nervous with increased bleeding. Mild cramping  LMP: 03/13/24 Onset of complaint: several days Pain score: 4 Vitals:   04/23/24 2005 04/23/24 2008  BP:  (!) 105/59  Pulse: 66   Resp: 17   Temp: 99 F (37.2 C)   SpO2: 99%      FHT: na  Lab orders placed from triage: upt, wet prep, gc, ua

## 2024-04-23 NOTE — MAU Provider Note (Signed)
 History     [id number]  Arrival date and time: 04/23/24 1858    Chief Complaint  Patient presents with   Vaginal Bleeding     HPI Jill Sweeney is a 23 y.o. at [redacted]w[redacted]d by LMP, who presents for vaginal bleeding. She states bleeding was light this morning and started getting heavier as the day progressed. It has lighted up since that time. Denies passage of clots or tissue. Also complains of being a little dizzy. Denies near syncope or syncopal episode. She is concerned because she bled so much her last miscarriage that she required 2 units of blood. She denies vaginal irritation or discharge. She endorses mild cramping.        Past Medical History:  Diagnosis Date   Anemia    Wheezing    as a small child    No past surgical history on file.  Family History  Problem Relation Age of Onset   Obesity Mother    Hypothyroidism Mother    Diabetes Father     Social History   Socioeconomic History   Marital status: Single    Spouse name: Not on file   Number of children: Not on file   Years of education: Not on file   Highest education level: Not on file  Occupational History   Not on file  Tobacco Use   Smoking status: Never   Smokeless tobacco: Never  Substance and Sexual Activity   Alcohol use: No   Drug use: No   Sexual activity: Not on file  Other Topics Concern   Not on file  Social History Narrative   Just graduated, taking a Gap year. Lives with mom.   Social Drivers of Corporate investment banker Strain: Low Risk  (03/03/2022)   Received from [facility]   Overall Financial Resource Strain (CARDIA)    Difficulty of Paying Living Expenses: Not hard at all  Food Insecurity: No Food Insecurity (12/06/2023)   Received from [facility]   Hunger Vital Sign    Within the past 12 months, you worried that your food would run out before you got the money to buy more.: Never true    Within the past 12 months, the food you bought just didn't last  and you didn't have money to get more.: Never true  Transportation Needs: No Transportation Needs (12/06/2023)   [facility] - Transportation    Lack of Transportation (Medical): No    Lack of Transportation (Non-Medical): No  Physical Activity: Not on file  Stress: Not on file  Social Connections: Not on file  Intimate Partner Violence: Not on file    No Known Allergies  No current facility-administered medications on file prior to encounter.   Current Outpatient Medications on File Prior to Encounter  Medication Sig Dispense Refill   amoxicillin  (AMOXIL ) 250 MG/5ML suspension Take 10 mL (500 mg total) by mouth 3 (three) times daily. (Patient not taking: Reported on 09/03/2017) 210 mL 0   amoxicillin  (AMOXIL ) 500 MG capsule Take 1 capsule (500 mg total) by mouth 3 (three) times daily. (Patient not taking: Reported on 09/03/2017) 21 capsule 0   cyclobenzaprine  (FLEXERIL ) 10 MG tablet Take 1 tablet (10 mg total) by mouth at bedtime. 10 tablet 0   ibuprofen  (ADVIL ,MOTRIN ) 100 MG/5ML suspension Take 25.6 mLs (512 mg total) by mouth every 6 (six) hours as needed for fever or mild pain. (Patient not taking: Reported on 09/03/2017) 237  mL 0   ibuprofen  (CHILD IBUPROFEN ) 100 MG/5ML suspension Take 20 mLs (400 mg total) by mouth every 6 (six) hours as needed (sore throat). (Patient not taking: Reported on 09/03/2017) 200 mL 0   Multiple Vitamin (MULTIVITAMIN WITH MINERALS) TABS tablet Take 1 tablet by mouth daily.     ondansetron  (ZOFRAN ) 4 MG tablet Take 1 tablet (4 mg total) by mouth every 8 (eight) hours as needed for nausea or vomiting. (Patient not taking: Reported on 02/02/2019) 4 tablet 0    Pertinent positives and negative per HPI, all others reviewed and negative  Physical Exam   BP (!) 105/59   Pulse 66   Temp 99 F (37.2 C)   Resp 17   Ht 4' 11 (1.499 m)   Wt 50.3 kg   LMP 03/13/2024   SpO2 99%   BMI 22.42 kg/m   Patient Vitals for the past 24  hrs:  BP Temp Pulse Resp SpO2 Height Weight  04/23/24 2008 (!) 105/59 -- -- -- -- -- --  04/23/24 2005 -- 99 F (37.2 C) 66 17 99 % 4' 11 (1.499 m) 50.3 kg    Physical Exam Vitals and nursing note reviewed.  Constitutional:      Appearance: She is well-developed.  HENT:     Head: Normocephalic and atraumatic.     Mouth/Throat:     Mouth: Mucous membranes are moist.  Eyes:     Extraocular Movements: Extraocular movements intact.  Cardiovascular:     Rate and Rhythm: Normal rate and regular rhythm.  Pulmonary:     Effort: Pulmonary effort is normal.  Abdominal:     Palpations: Abdomen is soft.     Tenderness: There is no abdominal tenderness.  Skin:    Capillary Refill: Capillary refill takes less than 2 seconds.  Neurological:     General: No focal deficit present.     Mental Status: She is alert.     Labs No results found for this or any previous visit (from the past 24 hours).  Imaging No results found.  MAU Course  Procedures  Lab Orders         CBC         hCG, quantitative, pregnancy    No orders of the defined types were placed in this encounter.  Imaging Orders  No imaging studies ordered today    MDM Moderate (Level 3-4)  Assessment and Plan  #Vaginal Bleeding #[redacted] weeks gestation of pregnancy ***  -She is [redacted]w[redacted]d by LMP -Per chart review had HCG of of 236 on 9/8 and 374 on 9/10 outpatient. TVUS at the time showed no uterine gestation sac with no evidence of adnexal mass.  -CBC shows  -HCG this visit    Ziaire Bieser L Raphael Fitzpatrick, MD/MHA 04/23/24 8:16 PM  Allergies as of 04/23/2024   No Known Allergies   Med Rec must be completed prior to using this [facility]***

## 2024-04-24 LAB — GC/CHLAMYDIA PROBE AMP (~~LOC~~) NOT AT ARMC
Chlamydia: NEGATIVE
Comment: NEGATIVE
Comment: NORMAL
Neisseria Gonorrhea: NEGATIVE

## 2024-04-25 ENCOUNTER — Other Ambulatory Visit (HOSPITAL_COMMUNITY)

## 2024-04-26 ENCOUNTER — Encounter (HOSPITAL_COMMUNITY): Payer: Self-pay | Admitting: *Deleted

## 2024-04-26 ENCOUNTER — Inpatient Hospital Stay (HOSPITAL_COMMUNITY)
Admission: AD | Admit: 2024-04-26 | Discharge: 2024-04-26 | Discharge status: Against Medical Advice | Attending: Obstetrics and Gynecology | Admitting: Obstetrics and Gynecology

## 2024-04-26 ENCOUNTER — Telehealth: Payer: Self-pay | Admitting: Obstetrics and Gynecology

## 2024-04-26 DIAGNOSIS — O3680X0 Pregnancy with inconclusive fetal viability, not applicable or unspecified: Secondary | ICD-10-CM

## 2024-04-26 DIAGNOSIS — Z5321 Procedure and treatment not carried out due to patient leaving prior to being seen by health care provider: Secondary | ICD-10-CM | POA: Insufficient documentation

## 2024-04-26 DIAGNOSIS — R109 Unspecified abdominal pain: Secondary | ICD-10-CM | POA: Insufficient documentation

## 2024-04-26 DIAGNOSIS — Z3A00 Weeks of gestation of pregnancy not specified: Secondary | ICD-10-CM

## 2024-04-26 HISTORY — DX: Urinary tract infection, site not specified: N39.0

## 2024-04-26 HISTORY — DX: Anxiety disorder, unspecified: F41.9

## 2024-04-26 LAB — HCG, QUANTITATIVE, PREGNANCY: hCG, Beta Chain, Quant, S: 30 m[IU]/mL — ABNORMAL HIGH (ref <5)

## 2024-04-26 NOTE — MAU Provider Note (Signed)
 Patient left immediately after blood was drawn without talking to provider first.  Ala Cart, CNM  04/26/2024 3:50 PM

## 2024-04-26 NOTE — Telephone Encounter (Signed)
 No answer - HIPAA compliant message left by [employer], Raquel. Patient instructed to call MAU providers' office back at [phone].   Report given to Dr. Jomarie, if patient returns call tonight.  Tallia Moehring, CNM

## 2024-04-26 NOTE — MAU Note (Signed)
 Jill Sweeney is a 23 y.o. at Unknown here in MAU reporting: bleeding and cramping continue, but less.  Not soaking pads. Has been dizzy though.   Onset of complaint: ongoing Pain score: mild Vitals:   04/26/24 1344  BP: (!) 96/46  Pulse: 81  Resp: 16  Temp: 99.4 F (37.4 C)  SpO2: 99%      Lab orders placed from triage:

## 2024-04-26 NOTE — MAU Note (Signed)
 Front desk called back and stated patient had signed AMA waiver and had left

## 2024-04-26 NOTE — MAU Note (Signed)
 Call from registration, pt just signed out AMA

## 2024-06-17 DIAGNOSIS — Z419 Encounter for procedure for purposes other than remedying health state, unspecified: Secondary | ICD-10-CM | POA: Diagnosis not present

## 2024-07-01 ENCOUNTER — Encounter: Payer: Self-pay | Admitting: Family Medicine

## 2024-07-01 ENCOUNTER — Inpatient Hospital Stay (HOSPITAL_COMMUNITY)
Admission: AD | Admit: 2024-07-01 | Discharge: 2024-07-01 | Disposition: A | Discharge status: Home / Self Care | Attending: Student | Admitting: Student

## 2024-07-01 ENCOUNTER — Inpatient Hospital Stay (HOSPITAL_COMMUNITY)

## 2024-07-01 DIAGNOSIS — Z3A01 Less than 8 weeks gestation of pregnancy: Secondary | ICD-10-CM

## 2024-07-01 DIAGNOSIS — O200 Threatened abortion: Secondary | ICD-10-CM | POA: Insufficient documentation

## 2024-07-01 DIAGNOSIS — N898 Other specified noninflammatory disorders of vagina: Secondary | ICD-10-CM

## 2024-07-01 DIAGNOSIS — O4691 Antepartum hemorrhage, unspecified, first trimester: Secondary | ICD-10-CM | POA: Diagnosis present

## 2024-07-01 LAB — URINALYSIS, ROUTINE W REFLEX MICROSCOPIC
Bilirubin Urine: NEGATIVE
Glucose, UA: 50 mg/dL — AB
Hgb urine dipstick: NEGATIVE
Ketones, ur: 20 mg/dL — AB
Nitrite: NEGATIVE
Protein, ur: NEGATIVE mg/dL
Specific Gravity, Urine: 1.02 (ref 1.005–1.030)
pH: 6 (ref 5.0–8.0)

## 2024-07-01 LAB — CBC
HCT: 33.6 % — ABNORMAL LOW (ref 36.0–46.0)
Hemoglobin: 11 g/dL — ABNORMAL LOW (ref 12.0–15.0)
MCH: 25.6 pg — ABNORMAL LOW (ref 26.0–34.0)
MCHC: 32.7 g/dL (ref 30.0–36.0)
MCV: 78.3 fL — ABNORMAL LOW (ref 80.0–100.0)
Platelets: 257 K/uL (ref 150–400)
RBC: 4.29 MIL/uL (ref 3.87–5.11)
RDW: 14.6 % (ref 11.5–15.5)
WBC: 7.1 K/uL (ref 4.0–10.5)
nRBC: 0 % (ref 0.0–0.2)

## 2024-07-01 LAB — ABO/RH: ABO/RH(D): O POS

## 2024-07-01 LAB — POCT PREGNANCY, URINE
Preg Test, Ur: POSITIVE — AB
Preg Test, Ur: POSITIVE — AB

## 2024-07-01 LAB — HCG, QUANTITATIVE, PREGNANCY: hCG, Beta Chain, Quant, S: 56954 m[IU]/mL — ABNORMAL HIGH (ref <5)

## 2024-07-01 MED ORDER — METRONIDAZOLE 0.75 % VA GEL: 1.0000 | Freq: Every day | VAGINAL | 1 refills | Status: AC

## 2024-07-01 NOTE — MAU Note (Signed)
 Jill Sweeney is a 23 y.o. at Unknown here in MAU reporting: had miscarriage two months ago.  Received +UPT 06/23/24 and has started having having vaginal bleeding that is light.  Pt is having cramping but has not taken any medication.   LMP: 05/20/24  Onset of complaint: three days ago  Pain score: 4 Vitals:   07/01/24 1808  BP: (!) 98/54  Pulse: 78  Resp: 18  Temp: 98.8 F (37.1 C)  SpO2: 98%     FHT: less than 10 weeks   Lab orders placed from triage: UA

## 2024-07-01 NOTE — MAU Provider Note (Signed)
 History     CSN: [id number]  Arrival date and time: 07/01/24 1651   Event Date/Time   First Provider Initiated Contact with Patient 07/01/24 2052      Chief Complaint  Patient presents with   Vaginal Bleeding    Jill Sweeney is a 23 y.o. G3P0020 at [redacted]w[redacted]d by Definite LMP of 05/20/2024 who receives care at [facility].  She reports her last appt was 2 months ago. She presents today for vaginal bleeding.  She states it is light and describes it as light pink and is accompanied by cramping. She states it is noted with wiping and a little bit on her pad. She reports noting bleach malodor discharge prior to bleeding onset.  She states the bleeding started 3 days ago, but the cramping has been present since discovering she was pregnant 11/18.  She states she has not taken anything for the cramping and rates it a 4/10.   {GYN/OB YK:[id number]}  Past Medical History:  Diagnosis Date   Anemia    Anxiety    Blood transfusion without reported diagnosis 2023   with blood loss from miscarriage   UTI (urinary tract infection)    Wheezing    as a small child    Past Surgical History:  Procedure Laterality Date   NO PAST SURGERIES      Family History  Problem Relation Age of Onset   Hyperlipidemia Mother    Hypertension Mother    Obesity Mother    Hypothyroidism Mother    Hyperlipidemia Father    Hypertension Father    Diabetes Father    Glaucoma Father     Social History   Tobacco Use   Smoking status: Never   Smokeless tobacco: Never  Vaping Use   Vaping status: Never Used  Substance Use Topics   Alcohol use: Not Currently    Comment: occ   Drug use: No    Allergies: No Known Allergies  Medications Prior to Admission  Medication Sig Dispense Refill Last Dose/Taking   escitalopram (LEXAPRO) 5 MG tablet Take 5 mg by mouth daily.   07/01/2024   Multiple Vitamin (MULTIVITAMIN WITH MINERALS) TABS tablet Take 1 tablet by mouth daily.   07/01/2024     Review of Systems  Genitourinary:  Positive for vaginal bleeding. Negative for difficulty urinating, dysuria and vaginal discharge.   Physical Exam   Blood pressure (!) 98/54, pulse 78, temperature 98.8 F (37.1 C), temperature source Oral, resp. rate 18, height 4' 11 (1.499 m), weight 50.8 kg, last menstrual period 05/20/2024, SpO2 98%, unknown if currently breastfeeding.  Physical Exam  MAU Course  Procedures Results for orders placed or performed during the hospital encounter of 07/01/24 (from the past 24 hours)  Pregnancy, urine POC     Status: Abnormal   Collection Time: 07/01/24  6:19 PM  Result Value Ref Range   Preg Test, Ur POSITIVE (A) NEGATIVE  Urinalysis, Routine w reflex microscopic -Urine, Unspecified Source     Status: Abnormal   Collection Time: 07/01/24  6:36 PM  Result Value Ref Range   Color, Urine YELLOW YELLOW   APPearance HAZY (A) CLEAR   Specific Gravity, Urine 1.020 1.005 - 1.030   pH 6.0 5.0 - 8.0   Glucose, UA 50 (A) NEGATIVE mg/dL   Hgb urine dipstick NEGATIVE NEGATIVE   Bilirubin Urine NEGATIVE NEGATIVE   Ketones, ur 20 (A) NEGATIVE mg/dL   Protein, ur NEGATIVE NEGATIVE mg/dL   Nitrite NEGATIVE NEGATIVE   Leukocytes,Ua  TRACE (A) NEGATIVE   RBC / HPF 0-5 0 - 5 RBC/hpf   WBC, UA 0-5 0 - 5 WBC/hpf   Bacteria, UA RARE (A) NONE SEEN   Squamous Epithelial / HPF 0-5 0 - 5 /HPF   Mucus PRESENT   CBC     Status: Abnormal   Collection Time: 07/01/24  6:46 PM  Result Value Ref Range   WBC 7.1 4.0 - 10.5 K/uL   RBC 4.29 3.87 - 5.11 MIL/uL   Hemoglobin 11.0 (L) 12.0 - 15.0 g/dL   HCT 66.3 (L) 63.9 - 53.9 %   MCV 78.3 (L) 80.0 - 100.0 fL   MCH 25.6 (L) 26.0 - 34.0 pg   MCHC 32.7 30.0 - 36.0 g/dL   RDW 85.3 88.4 - 84.4 %   Platelets 257 150 - 400 K/uL   nRBC 0.0 0.0 - 0.2 %  ABO/Rh     Status: None   Collection Time: 07/01/24  6:46 PM  Result Value Ref Range   ABO/RH(D) O POS    No rh immune globuloin      NOT A RH IMMUNE GLOBULIN CANDIDATE,  PT RH POSITIVE Performed at [facility], [address], [place], [place] [address]   hCG, quantitative, pregnancy     Status: Abnormal   Collection Time: 07/01/24  6:46 PM  Result Value Ref Range   hCG, Beta Chain, Quant, S 56,954 (H) <5 mIU/mL  Pregnancy, urine POC     Status: Abnormal   Collection Time: 07/01/24  7:12 PM  Result Value Ref Range   Preg Test, Ur POSITIVE (A) NEGATIVE   US  OB LESS THAN 14 WEEKS WITH OB TRANSVAGINAL Result Date: 07/01/2024 EXAM: ULTRASOUND FIRST TRIMESTER TECHNIQUE: Transabdominal and Transvaginal first trimester obstetric pelvic duplex ultrasound was performed with real-time imaging, color flow Doppler imaging, and spectral analysis. COMPARISON: None available. CLINICAL HISTORY: Vaginal bleeding in pregnancy, first trimester. FINDINGS: UTERUS: No focal myometrial mass. GESTATIONAL SAC(S): Single normal appearing gestational sac. No subchorionic hemorrhage. YOLK SAC: Present. EMBRYO(<11WK) /FETUS(>=11WK): Embryo present. CROWN RUMP LENGTH: 2.6 mm. RATE OF CARDIAC ACTIVITY: 89 beats per minute. RIGHT OVARY: Unremarkable. LEFT OVARY: Unremarkable. FREE FLUID: No free fluid. MEASUREMENTS ESTIMATED GESTATIONAL AGE BY CURRENT ULTRASOUND: 5 weeks 6 days. ESTIMATED GESTATIONAL AGE BY LMP: 6 weeks 0 days. ESTIMATED DUE DATE: 02/25/2025 (by ultrasound) and 02/24/2025 (by LMP). IMPRESSION: 1. Single intrauterine gestation with yolk sac and embryo. 2. Fetal heart rate of 89 beats per minute, which is bradycardic, likely due to early gestational age. This could be followed with repeat ultrasound in 10 to 14 days to ensure expected progression. 3. Crown-rump length 2.6 mm, corresponding to an estimated gestational age of [redacted] weeks 6 days, with dates concordant within expected limits. Electronically signed by: Franky Crease MD 07/01/2024 07:24 PM EST RP Workstation: [id number]    MDM ***  Assessment and Plan  ***  Harlene LITTIE Duncans 07/01/2024, 8:52 PM

## 2024-07-03 LAB — GC/CHLAMYDIA PROBE AMP (~~LOC~~) NOT AT ARMC
Chlamydia: NEGATIVE
Comment: NEGATIVE
Comment: NORMAL
Neisseria Gonorrhea: NEGATIVE
